# Patient Record
Sex: Female | Born: 1950 | Race: White | Hispanic: No | Marital: Married | Smoking: Former smoker
Health system: Southern US, Community
[De-identification: ages and names within clinical notes are randomized; demographics above are authoritative.]

## PROBLEM LIST (undated history)

## (undated) DIAGNOSIS — Z87442 Personal history of urinary calculi: Secondary | ICD-10-CM

## (undated) DIAGNOSIS — J40 Bronchitis, not specified as acute or chronic: Secondary | ICD-10-CM

## (undated) DIAGNOSIS — N390 Urinary tract infection, site not specified: Secondary | ICD-10-CM

## (undated) DIAGNOSIS — K759 Inflammatory liver disease, unspecified: Secondary | ICD-10-CM

## (undated) DIAGNOSIS — M1990 Unspecified osteoarthritis, unspecified site: Secondary | ICD-10-CM

## (undated) DIAGNOSIS — I639 Cerebral infarction, unspecified: Secondary | ICD-10-CM

## (undated) DIAGNOSIS — I739 Peripheral vascular disease, unspecified: Secondary | ICD-10-CM

## (undated) DIAGNOSIS — J45909 Unspecified asthma, uncomplicated: Secondary | ICD-10-CM

## (undated) DIAGNOSIS — J189 Pneumonia, unspecified organism: Secondary | ICD-10-CM

## (undated) DIAGNOSIS — C801 Malignant (primary) neoplasm, unspecified: Secondary | ICD-10-CM

## (undated) HISTORY — PX: BREAST SURGERY: SHX581

## (undated) HISTORY — PX: TONSILLECTOMY: SUR1361

---

## 1995-08-14 HISTORY — PX: FOOT SURGERY: SHX648

## 2005-08-13 DIAGNOSIS — I739 Peripheral vascular disease, unspecified: Secondary | ICD-10-CM

## 2005-08-13 DIAGNOSIS — I639 Cerebral infarction, unspecified: Secondary | ICD-10-CM

## 2005-08-13 HISTORY — DX: Peripheral vascular disease, unspecified: I73.9

## 2005-08-13 HISTORY — DX: Cerebral infarction, unspecified: I63.9

## 2016-07-26 ENCOUNTER — Ambulatory Visit: Payer: Self-pay | Admitting: Physician Assistant

## 2016-08-08 ENCOUNTER — Encounter (HOSPITAL_COMMUNITY)
Admission: RE | Admit: 2016-08-08 | Discharge: 2016-08-08 | Disposition: A | Discharge status: Home / Self Care | Payer: Worker's Compensation | Source: Ambulatory Visit | Attending: Orthopedic Surgery | Admitting: Orthopedic Surgery

## 2016-08-08 ENCOUNTER — Encounter (HOSPITAL_COMMUNITY): Payer: Self-pay | Admitting: *Deleted

## 2016-08-08 DIAGNOSIS — Z01812 Encounter for preprocedural laboratory examination: Secondary | ICD-10-CM | POA: Diagnosis not present

## 2016-08-08 HISTORY — DX: Unspecified osteoarthritis, unspecified site: M19.90

## 2016-08-08 HISTORY — DX: Malignant (primary) neoplasm, unspecified: C80.1

## 2016-08-08 HISTORY — DX: Urinary tract infection, site not specified: N39.0

## 2016-08-08 HISTORY — DX: Inflammatory liver disease, unspecified: K75.9

## 2016-08-08 HISTORY — DX: Unspecified asthma, uncomplicated: J45.909

## 2016-08-08 HISTORY — DX: Pneumonia, unspecified organism: J18.9

## 2016-08-08 HISTORY — DX: Personal history of urinary calculi: Z87.442

## 2016-08-08 HISTORY — DX: Cerebral infarction, unspecified: I63.9

## 2016-08-08 HISTORY — DX: Peripheral vascular disease, unspecified: I73.9

## 2016-08-08 HISTORY — DX: Bronchitis, not specified as acute or chronic: J40

## 2016-08-08 LAB — COMPREHENSIVE METABOLIC PANEL
ALBUMIN: 4 g/dL (ref 3.5–5.0)
ALK PHOS: 46 U/L (ref 38–126)
ALT: 25 U/L (ref 14–54)
AST: 43 U/L — ABNORMAL HIGH (ref 15–41)
Anion gap: 7 (ref 5–15)
BUN: 19 mg/dL (ref 6–20)
CALCIUM: 10.1 mg/dL (ref 8.9–10.3)
CO2: 27 mmol/L (ref 22–32)
CREATININE: 0.67 mg/dL (ref 0.44–1.00)
Chloride: 104 mmol/L (ref 101–111)
GFR calc Af Amer: >60 mL/min (ref >60)
GFR calc non Af Amer: >60 mL/min (ref >60)
GLUCOSE: 86 mg/dL (ref 65–99)
Potassium: 4.7 mmol/L (ref 3.5–5.1)
SODIUM: 138 mmol/L (ref 135–145)
Total Bilirubin: 0.9 mg/dL (ref 0.3–1.2)
Total Protein: 6.8 g/dL (ref 6.5–8.1)

## 2016-08-08 LAB — CBC
HEMATOCRIT: 39 % (ref 36.0–46.0)
HEMOGLOBIN: 12.9 g/dL (ref 12.0–15.0)
MCH: 30.2 pg (ref 26.0–34.0)
MCHC: 33.1 g/dL (ref 30.0–36.0)
MCV: 91.3 fL (ref 78.0–100.0)
Platelets: 310 10*3/uL (ref 150–400)
RBC: 4.27 MIL/uL (ref 3.87–5.11)
RDW: 14 % (ref 11.5–15.5)
WBC: 4.4 10*3/uL (ref 4.0–10.5)

## 2016-08-08 NOTE — Pre-Procedure Instructions (Addendum)
Roberta Travis  08/08/2016      [id number] [place], [place] [address] Phone: [phone] Fax: [phone]    Your procedure is scheduled on 08/16/15  Report to [facility] Admitting at 630 A.M.  Call this number if you have problems the morning of surgery:  [phone]   Remember:  Do not eat food or drink liquids after midnight.  Take these medicines the morning of surgery with A SIP OF WATER  Gabapentin, inhaler if needed(bring to hosp)., doxycycline   STOP all herbel meds, nsaids (aleve,naproxen,advil,ibuprofen)startingTODAY including all vitamins,supplements,aspirin   Do not wear jewelry, make-up or nail polish.  Do not wear lotions, powders, or perfumes, or deoderant.  Do not shave 48 hours prior to surgery.  Men may shave face and neck.  Do not bring valuables to the hospital.  [facility] is not responsible for any belongings or valuables.  Contacts, dentures or bridgework may not be worn into surgery.  Leave your suitcase in the car.  After surgery it may be brought to your room.  For patients admitted to the hospital, discharge time will be determined by your treatment team.  Patients discharged the day of surgery will not be allowed to drive home.   Special instructions:  . Special Instructions: [facility] - Preparing for Surgery  Before surgery, you can play an important role.  Because skin is not sterile, your skin needs to be as free of germs as possible.  You can reduce the number of germs on you skin by washing with CHG (chlorahexidine gluconate) soap before surgery.  CHG is an antiseptic cleaner which kills germs and bonds with the skin to continue killing germs even after washing.  Please DO NOT use if you have an allergy to CHG or antibacterial soaps.  If your skin becomes reddened/irritated stop using the CHG and inform your nurse when you arrive at [facility]  Stay.  Do not shave (including legs and underarms) for at least 48 hours prior to the first CHG shower.  You may shave your face.  Please follow these instructions carefully:   1.  Shower with CHG Soap the night before surgery and the morning of Surgery.  2.  If you choose to wash your hair, wash your hair first as usual with your normal shampoo.  3.  After you shampoo, rinse your hair and body thoroughly to remove the Shampoo.  4.  Use CHG as you would any other liquid soap.  You can apply chg directly  to the skin and wash gently with scrungie or a clean washcloth.  5.  Apply the CHG Soap to your body ONLY FROM THE NECK DOWN.  Do not use on open wounds or open sores.  Avoid contact with your eyes ears, mouth and genitals (private parts).  Wash genitals (private parts)       with your normal soap.  6.  Wash thoroughly, paying special attention to the area where your surgery will be performed.  7.  Thoroughly rinse your body with warm water from the neck down.  8.  DO NOT shower/wash with your normal soap after using and rinsing off the CHG Soap.  9.  Pat yourself dry with a clean towel.            10.  Wear clean pajamas.            11.  Place clean sheets on  your bed the night of your first shower and do not sleep with pets.  Day of Surgery  Do not apply any lotions/deodorants the morning of surgery.  Please wear clean clothes to the hospital/surgery center.  Please read over the factt sheets that you were given.

## 2016-08-14 NOTE — Anesthesia Preprocedure Evaluation (Signed)
Anesthesia Evaluation  Patient identified by MRN, date of birth, ID band Patient awake    Reviewed: Allergy & Precautions, H&P , Patient's Chart, lab work & pertinent test results, reviewed documented beta blocker date and time   Airway Mallampati: II  TM Distance: >3 FB Neck ROM: full    Dental no notable dental hx.    Pulmonary former smoker,    Pulmonary exam normal breath sounds clear to auscultation       Cardiovascular  Rhythm:regular Rate:Normal     Neuro/Psych    GI/Hepatic   Endo/Other    Renal/GU      Musculoskeletal   Abdominal   Peds  Hematology   Anesthesia Other Findings   Reproductive/Obstetrics                             Anesthesia Physical Anesthesia Plan  ASA: II  Anesthesia Plan: General   Post-op Pain Management:    Induction: Intravenous  Airway Management Planned: Oral ETT  Additional Equipment:   Intra-op Plan:   Post-operative Plan: Extubation in OR  Informed Consent: I have reviewed the patients History and Physical, chart, labs and discussed the procedure including the risks, benefits and alternatives for the proposed anesthesia with the patient or authorized representative who has indicated his/her understanding and acceptance.   Dental Advisory Given and Dental advisory given  Plan Discussed with: CRNA and Surgeon  Anesthesia Plan Comments: (  Discussed general anesthesia, including possible nausea, instrumentation of airway, sore throat,pulmonary aspiration, etc. I asked if the were any outstanding questions, or  concerns before we proceeded.)        Anesthesia Quick Evaluation  

## 2016-08-15 ENCOUNTER — Ambulatory Visit (HOSPITAL_COMMUNITY): Payer: Worker's Compensation | Admitting: Anesthesiology

## 2016-08-15 ENCOUNTER — Observation Stay (HOSPITAL_COMMUNITY)
Admission: RE | Admit: 2016-08-15 | Discharge: 2016-08-15 | Disposition: A | Discharge status: Home / Self Care | Payer: Worker's Compensation | Source: Ambulatory Visit | Attending: Orthopedic Surgery | Admitting: Orthopedic Surgery

## 2016-08-15 ENCOUNTER — Encounter (HOSPITAL_COMMUNITY)
Admission: RE | Disposition: A | Discharge status: Home / Self Care | Payer: Self-pay | Source: Ambulatory Visit | Attending: Orthopedic Surgery

## 2016-08-15 ENCOUNTER — Encounter (HOSPITAL_COMMUNITY): Payer: Self-pay | Admitting: Certified Registered Nurse Anesthetist

## 2016-08-15 ENCOUNTER — Ambulatory Visit (HOSPITAL_COMMUNITY): Payer: Worker's Compensation

## 2016-08-15 DIAGNOSIS — Z85828 Personal history of other malignant neoplasm of skin: Secondary | ICD-10-CM | POA: Diagnosis not present

## 2016-08-15 DIAGNOSIS — J45909 Unspecified asthma, uncomplicated: Secondary | ICD-10-CM | POA: Insufficient documentation

## 2016-08-15 DIAGNOSIS — S32040A Wedge compression fracture of fourth lumbar vertebra, initial encounter for closed fracture: Secondary | ICD-10-CM | POA: Diagnosis present

## 2016-08-15 DIAGNOSIS — I739 Peripheral vascular disease, unspecified: Secondary | ICD-10-CM | POA: Diagnosis not present

## 2016-08-15 DIAGNOSIS — Z86718 Personal history of other venous thrombosis and embolism: Secondary | ICD-10-CM | POA: Insufficient documentation

## 2016-08-15 DIAGNOSIS — M8088XA Other osteoporosis with current pathological fracture, vertebra(e), initial encounter for fracture: Secondary | ICD-10-CM | POA: Diagnosis not present

## 2016-08-15 DIAGNOSIS — Z8673 Personal history of transient ischemic attack (TIA), and cerebral infarction without residual deficits: Secondary | ICD-10-CM | POA: Insufficient documentation

## 2016-08-15 DIAGNOSIS — Z419 Encounter for procedure for purposes other than remedying health state, unspecified: Secondary | ICD-10-CM

## 2016-08-15 DIAGNOSIS — Z87442 Personal history of urinary calculi: Secondary | ICD-10-CM | POA: Diagnosis not present

## 2016-08-15 DIAGNOSIS — Z87891 Personal history of nicotine dependence: Secondary | ICD-10-CM | POA: Insufficient documentation

## 2016-08-15 DIAGNOSIS — M1990 Unspecified osteoarthritis, unspecified site: Secondary | ICD-10-CM | POA: Insufficient documentation

## 2016-08-15 DIAGNOSIS — M4316 Spondylolisthesis, lumbar region: Secondary | ICD-10-CM | POA: Diagnosis not present

## 2016-08-15 HISTORY — PX: KYPHOPLASTY: SHX5884

## 2016-08-15 SURGERY — KYPHOPLASTY
Anesthesia: General | Site: Spine Lumbar

## 2016-08-15 MED ORDER — OXYCODONE-ACETAMINOPHEN 5-325 MG PO TABS: 1.0000 | ORAL_TABLET | ORAL | Status: DC | PRN

## 2016-08-15 MED ORDER — MENTHOL 3 MG MT LOZG: 1.0000 | LOZENGE | OROMUCOSAL | Status: DC | PRN

## 2016-08-15 MED ORDER — PROPOFOL 10 MG/ML IV BOLUS
INTRAVENOUS | Status: AC
  Filled 2016-08-15: qty 20

## 2016-08-15 MED ORDER — PROPOFOL 10 MG/ML IV BOLUS
INTRAVENOUS | Status: DC | PRN
  Administered 2016-08-15 (×2): 50 mg via INTRAVENOUS
  Administered 2016-08-15: 150 mg via INTRAVENOUS

## 2016-08-15 MED ORDER — IOPAMIDOL (ISOVUE-300) INJECTION 61%
INTRAVENOUS | Status: AC
  Filled 2016-08-15: qty 50

## 2016-08-15 MED ORDER — EPHEDRINE SULFATE 50 MG/ML IJ SOLN
INTRAMUSCULAR | Status: DC | PRN
  Administered 2016-08-15: 10 mg via INTRAVENOUS
  Administered 2016-08-15: 5 mg via INTRAVENOUS
  Administered 2016-08-15: 10 mg via INTRAVENOUS

## 2016-08-15 MED ORDER — FENTANYL CITRATE (PF) 100 MCG/2ML IJ SOLN: 25.0000 ug | INTRAMUSCULAR | Status: DC | PRN

## 2016-08-15 MED ORDER — DEXAMETHASONE SODIUM PHOSPHATE 10 MG/ML IJ SOLN
INTRAMUSCULAR | Status: AC
  Filled 2016-08-15: qty 1

## 2016-08-15 MED ORDER — ALBUTEROL SULFATE HFA 108 (90 BASE) MCG/ACT IN AERS
INHALATION_SPRAY | RESPIRATORY_TRACT | Status: DC | PRN
  Administered 2016-08-15: 2 via RESPIRATORY_TRACT

## 2016-08-15 MED ORDER — METHOCARBAMOL 500 MG PO TABS: 500.0000 mg | ORAL_TABLET | Freq: Four times a day (QID) | ORAL | Status: DC | PRN

## 2016-08-15 MED ORDER — SODIUM CHLORIDE 0.9% FLUSH: 3.0000 mL | Freq: Two times a day (BID) | INTRAVENOUS | Status: DC

## 2016-08-15 MED ORDER — ALBUTEROL SULFATE HFA 108 (90 BASE) MCG/ACT IN AERS
INHALATION_SPRAY | RESPIRATORY_TRACT | Status: AC
  Filled 2016-08-15: qty 6.7

## 2016-08-15 MED ORDER — ONDANSETRON HCL 4 MG PO TABS: 4.0000 mg | ORAL_TABLET | Freq: Three times a day (TID) | ORAL | 0 refills | Status: AC | PRN

## 2016-08-15 MED ORDER — GLYCOPYRROLATE 0.2 MG/ML IJ SOLN
INTRAMUSCULAR | Status: DC | PRN
  Administered 2016-08-15 (×2): 0.1 mg via INTRAVENOUS

## 2016-08-15 MED ORDER — BUPIVACAINE HCL (PF) 0.25 % IJ SOLN
INTRAMUSCULAR | Status: DC | PRN
  Administered 2016-08-15: 10 mL

## 2016-08-15 MED ORDER — ACETAMINOPHEN 10 MG/ML IV SOLN
INTRAVENOUS | Status: AC
  Filled 2016-08-15: qty 100

## 2016-08-15 MED ORDER — LACTATED RINGERS IV SOLN
INTRAVENOUS | Status: DC | PRN
  Administered 2016-08-15 (×2): via INTRAVENOUS

## 2016-08-15 MED ORDER — FENTANYL CITRATE (PF) 100 MCG/2ML IJ SOLN
INTRAMUSCULAR | Status: AC
  Filled 2016-08-15: qty 2

## 2016-08-15 MED ORDER — CEFAZOLIN SODIUM-DEXTROSE 2-4 GM/100ML-% IV SOLN
2.0000 g | INTRAVENOUS | Status: AC
  Administered 2016-08-15: 2 g via INTRAVENOUS
  Filled 2016-08-15: qty 100

## 2016-08-15 MED ORDER — DEXAMETHASONE SODIUM PHOSPHATE 10 MG/ML IJ SOLN
INTRAMUSCULAR | Status: DC | PRN
  Administered 2016-08-15: 10 mg via INTRAVENOUS

## 2016-08-15 MED ORDER — DEXAMETHASONE SODIUM PHOSPHATE 4 MG/ML IJ SOLN: 4.0000 mg | Freq: Four times a day (QID) | INTRAMUSCULAR | Status: DC

## 2016-08-15 MED ORDER — FENTANYL CITRATE (PF) 100 MCG/2ML IJ SOLN
INTRAMUSCULAR | Status: DC | PRN
  Administered 2016-08-15: 100 ug via INTRAVENOUS

## 2016-08-15 MED ORDER — LIDOCAINE HCL (CARDIAC) 20 MG/ML IV SOLN
INTRAVENOUS | Status: DC | PRN
  Administered 2016-08-15: 100 mg via INTRATRACHEAL

## 2016-08-15 MED ORDER — SODIUM CHLORIDE 0.9% FLUSH: 3.0000 mL | INTRAVENOUS | Status: DC | PRN

## 2016-08-15 MED ORDER — METHOCARBAMOL 500 MG PO TABS: 500.0000 mg | ORAL_TABLET | Freq: Three times a day (TID) | ORAL | 0 refills | Status: AC | PRN

## 2016-08-15 MED ORDER — ARTIFICIAL TEARS OP OINT
TOPICAL_OINTMENT | OPHTHALMIC | Status: AC
  Filled 2016-08-15: qty 3.5

## 2016-08-15 MED ORDER — IOPAMIDOL (ISOVUE-300) INJECTION 61%
INTRAVENOUS | Status: DC | PRN
  Administered 2016-08-15: 50 mL

## 2016-08-15 MED ORDER — LIDOCAINE 2% (20 MG/ML) 5 ML SYRINGE
INTRAMUSCULAR | Status: AC
  Filled 2016-08-15: qty 5

## 2016-08-15 MED ORDER — SUGAMMADEX SODIUM 200 MG/2ML IV SOLN
INTRAVENOUS | Status: DC | PRN
  Administered 2016-08-15: 200 mg via INTRAVENOUS

## 2016-08-15 MED ORDER — METHOCARBAMOL 1000 MG/10ML IJ SOLN
500.0000 mg | Freq: Four times a day (QID) | INTRAVENOUS | Status: DC | PRN
  Filled 2016-08-15: qty 5

## 2016-08-15 MED ORDER — DEXAMETHASONE 4 MG PO TABS: 4.0000 mg | ORAL_TABLET | Freq: Four times a day (QID) | ORAL | Status: DC

## 2016-08-15 MED ORDER — ROCURONIUM BROMIDE 50 MG/5ML IV SOSY
PREFILLED_SYRINGE | INTRAVENOUS | Status: AC
  Filled 2016-08-15: qty 5

## 2016-08-15 MED ORDER — PHENYLEPHRINE 40 MCG/ML (10ML) SYRINGE FOR IV PUSH (FOR BLOOD PRESSURE SUPPORT)
PREFILLED_SYRINGE | INTRAVENOUS | Status: AC
  Filled 2016-08-15: qty 20

## 2016-08-15 MED ORDER — KETOROLAC TROMETHAMINE 30 MG/ML IJ SOLN: 15.0000 mg | Freq: Four times a day (QID) | INTRAMUSCULAR | Status: DC

## 2016-08-15 MED ORDER — MIDAZOLAM HCL 2 MG/2ML IJ SOLN
INTRAMUSCULAR | Status: AC
  Filled 2016-08-15: qty 2

## 2016-08-15 MED ORDER — EPHEDRINE 5 MG/ML INJ
INTRAVENOUS | Status: AC
  Filled 2016-08-15: qty 10

## 2016-08-15 MED ORDER — LACTATED RINGERS IV SOLN: INTRAVENOUS | Status: DC

## 2016-08-15 MED ORDER — MIDAZOLAM HCL 2 MG/2ML IJ SOLN
INTRAMUSCULAR | Status: DC | PRN
  Administered 2016-08-15 (×2): 1 mg via INTRAVENOUS

## 2016-08-15 MED ORDER — MORPHINE SULFATE (PF) 4 MG/ML IV SOLN: 1.0000 mg | INTRAVENOUS | Status: DC | PRN

## 2016-08-15 MED ORDER — ONDANSETRON HCL 4 MG/2ML IJ SOLN
INTRAMUSCULAR | Status: AC
  Filled 2016-08-15: qty 2

## 2016-08-15 MED ORDER — ONDANSETRON HCL 4 MG/2ML IJ SOLN: 4.0000 mg | INTRAMUSCULAR | Status: DC | PRN

## 2016-08-15 MED ORDER — ONDANSETRON HCL 4 MG/2ML IJ SOLN
INTRAMUSCULAR | Status: DC | PRN
  Administered 2016-08-15: 4 mg via INTRAVENOUS

## 2016-08-15 MED ORDER — ACETAMINOPHEN 10 MG/ML IV SOLN
INTRAVENOUS | Status: DC | PRN
  Administered 2016-08-15: 1000 mg via INTRAVENOUS

## 2016-08-15 MED ORDER — BUPIVACAINE HCL (PF) 0.25 % IJ SOLN
INTRAMUSCULAR | Status: AC
  Filled 2016-08-15: qty 30

## 2016-08-15 MED ORDER — ROCURONIUM BROMIDE 100 MG/10ML IV SOLN
INTRAVENOUS | Status: DC | PRN
  Administered 2016-08-15: 40 mg via INTRAVENOUS

## 2016-08-15 MED ORDER — SUGAMMADEX SODIUM 200 MG/2ML IV SOLN
INTRAVENOUS | Status: AC
  Filled 2016-08-15: qty 2

## 2016-08-15 MED ORDER — PHENYLEPHRINE HCL 10 MG/ML IJ SOLN
INTRAMUSCULAR | Status: DC | PRN
  Administered 2016-08-15: 120 ug via INTRAVENOUS
  Administered 2016-08-15 (×3): 200 ug via INTRAVENOUS
  Administered 2016-08-15: 80 ug via INTRAVENOUS

## 2016-08-15 MED ORDER — HYDROCODONE-ACETAMINOPHEN 5-325 MG PO TABS: 1.0000 | ORAL_TABLET | ORAL | 0 refills | Status: AC | PRN

## 2016-08-15 MED ORDER — CEFAZOLIN IN D5W 1 GM/50ML IV SOLN: 1.0000 g | Freq: Three times a day (TID) | INTRAVENOUS | Status: DC

## 2016-08-15 MED ORDER — PHENOL 1.4 % MT LIQD: 1.0000 | OROMUCOSAL | Status: DC | PRN

## 2016-08-15 SURGICAL SUPPLY — 44 items
BANDAGE ADH SHEER 1  50/CT (GAUZE/BANDAGES/DRESSINGS) ×2 IMPLANT
BLADE SURG 15 STRL LF DISP TIS (BLADE) ×1 IMPLANT
BLADE SURG 15 STRL SS (BLADE) ×1
BONE FILLER DEVICE STRL SZ3 (INSTRUMENTS) IMPLANT
CEMENT BONE KYPHX HV R (Orthopedic Implant) ×2 IMPLANT
CEMENT KYPHON C01A KIT/MIXER (Cement) ×2 IMPLANT
COVER MAYO STAND STRL (DRAPES) ×2 IMPLANT
CURETTE EXPRESS SZ2 7MM (INSTRUMENTS) ×1 IMPLANT
CURETTE WEDGE 8.5MM KYPHX (MISCELLANEOUS) IMPLANT
CURRETTE EXPRESS SZ2 7MM (INSTRUMENTS) ×2
DRAPE C-ARM 42X72 X-RAY (DRAPES) ×4 IMPLANT
DRAPE INCISE IOBAN 66X45 STRL (DRAPES) ×2 IMPLANT
DRAPE LAPAROTOMY T 102X78X121 (DRAPES) ×2 IMPLANT
DRAPE PROXIMA HALF (DRAPES) ×4 IMPLANT
DRSG AQUACEL AG ADV 3.5X10 (GAUZE/BANDAGES/DRESSINGS) ×2 IMPLANT
DURAPREP 26ML APPLICATOR (WOUND CARE) ×2 IMPLANT
ELECT PENCIL ROCKER SW 15FT (MISCELLANEOUS) ×2 IMPLANT
GAUZE SPONGE 4X4 16PLY XRAY LF (GAUZE/BANDAGES/DRESSINGS) ×2 IMPLANT
GLOVE BIO SURGEON STRL SZ 6.5 (GLOVE) ×2 IMPLANT
GLOVE BIOGEL PI IND STRL 6.5 (GLOVE) ×1 IMPLANT
GLOVE BIOGEL PI INDICATOR 6.5 (GLOVE) ×1
GLOVE SS BIOGEL STRL SZ 8.5 (GLOVE) ×2 IMPLANT
GLOVE SUPERSENSE BIOGEL SZ 8.5 (GLOVE) ×2
GOWN STRL REUS W/ TWL LRG LVL3 (GOWN DISPOSABLE) ×1 IMPLANT
GOWN STRL REUS W/TWL 2XL LVL3 (GOWN DISPOSABLE) ×2 IMPLANT
GOWN STRL REUS W/TWL LRG LVL3 (GOWN DISPOSABLE) ×1
KIT BASIN OR (CUSTOM PROCEDURE TRAY) ×2 IMPLANT
KIT ROOM TURNOVER OR (KITS) ×2 IMPLANT
NEEDLE HYPO 25X1 1.5 SAFETY (NEEDLE) ×2 IMPLANT
NEEDLE SPNL 18GX3.5 QUINCKE PK (NEEDLE) ×4 IMPLANT
NS IRRIG 1000ML POUR BTL (IV SOLUTION) ×2 IMPLANT
PACK SURGICAL SETUP 50X90 (CUSTOM PROCEDURE TRAY) ×2 IMPLANT
PACK UNIVERSAL I (CUSTOM PROCEDURE TRAY) ×2 IMPLANT
PAD ARMBOARD 7.5X6 YLW CONV (MISCELLANEOUS) ×4 IMPLANT
SURGIFLO W/THROMBIN 8M KIT (HEMOSTASIS) IMPLANT
SUT BONE WAX W31G (SUTURE) ×2 IMPLANT
SUT MON AB 3-0 SH 27 (SUTURE) ×1
SUT MON AB 3-0 SH27 (SUTURE) ×1 IMPLANT
SYR CONTROL 10ML LL (SYRINGE) ×2 IMPLANT
TOWEL OR 17X24 6PK STRL BLUE (TOWEL DISPOSABLE) ×2 IMPLANT
TOWEL OR 17X26 10 PK STRL BLUE (TOWEL DISPOSABLE) ×2 IMPLANT
TRAY KYPHOPAK 15/3 ONESTEP 1ST (MISCELLANEOUS) IMPLANT
TRAY KYPHOPAK 20/3 ONESTEP 1ST (MISCELLANEOUS) ×4 IMPLANT
WATER STERILE IRR 1000ML POUR (IV SOLUTION) ×2 IMPLANT

## 2016-08-15 NOTE — Transfer of Care (Signed)
Immediate Anesthesia Transfer of Care Note  Patient: Roberta Travis  Procedure(s) Performed: Procedure(s) with comments: L4 KYPHOPLASTY (N/A) - Requests 1 hour  Patient Location: PACU  Anesthesia Type:General  Level of Consciousness: awake, alert  and patient cooperative  Airway & Oxygen Therapy: Patient Spontanous Breathing and Patient connected to nasal cannula oxygen  Post-op Assessment: Report given to RN, Post -op Vital signs reviewed and stable, Patient moving all extremities X 4 and Patient able to stick tongue midline  Post vital signs: Reviewed and stable  Last Vitals:  Vitals:   08/15/16 0707 08/15/16 0941  BP: 133/63   Pulse: 75   Resp: 20   Temp: 36.6 C (P) 36.5 C    Last Pain:  Vitals:   08/15/16 0715  TempSrc:   PainSc: 3          Complications: No apparent anesthesia complications

## 2016-08-15 NOTE — H&P (Signed)
History of Present Illness  The patient is a 66 year old female who comes in today for a preoperative History and Physical. The patient is scheduled for a kyphoplasty L4 to be performed by Dr. Duane Lope D. Rolena Infante, MD at [facility] on 08-15-16 . Please see the hospital record for complete dictated history and physical. The pt reports a hx of a stroke 10 year ago.   Problem List/Past Medical  Lumbar spine pain (M54.5)  Spondylolisthesis of lumbar region (M43.16)  Lumbosacral DDD (M51.37)  Closed wedge compression fracture of fourth lumbar vertebra with routine healing (S32.040D)  Problems Reconciled   Allergies  Sulfa Drugs  EPINEPHrine *VASOPRESSORS*  Allergies Reconciled   Family History  Cancer  father, sister and brother Congestive Heart Failure  mother Hypertension  father and brother Osteoarthritis  mother Osteoporosis  mother and sister Severe allergy  father First Degree Relatives   Social History Tobacco use  former smoker; smoke(d) less than 1/2 pack(s) per day Tobacco / smoke exposure  no Alcohol use  current drinker; drinks wine and hard liquor; only occasionally per week Children  3 Current work status  working part time Drug/Alcohol Rehab (Currently)  no Drug/Alcohol Rehab (Previously)  no Exercise  Exercises daily; does running / walking and gym / weights Illicit drug use  no Living situation  live with spouse Marital status  married Number of flights of stairs before winded  greater than 5 Pain Contract  no  Medication History  Atorvastatin Calcium (20MG  Tablet, Oral) Active. Calcium Carbonate (600MG  Tablet, Oral) Active. DiphenhydrAMINE HCl (25MG  Tablet, Oral) Active. (prn) Doxycycline Hyclate (100MG  Capsule, Oral) Active. (qd) Metamucil (Oral) Specific strength unknown - Active. (qd) Vitamin D (2000UNIT Tablet, Oral) Active. (qd) Gabapentin (300MG  Capsule, Oral) Active. (bid Rx' d by Dr Bland Span ,  Neurologist) Tylenol Extra Strength (500MG  Tablet, Oral) Active. Medications Reconciled  Past Surgical History Breast Biopsy  left Foot Surgery  bilateral Tonsillectomy    Other Problems  Diverticulitis Of Colon  Kidney Stone   Vitals  08/14/2016 9:48 AM Weight: 155 lb Height: 66in Body Surface Area: 1.79 m Body Mass Index: 25.02 kg/m  Temp.: 97.38F(Oral)  Pulse: 77 (Regular)  BP: 153/81 (Sitting, Right Arm, Standard)  General General Appearance-Not in acute distress. Orientation-Oriented X3. Build & Nutrition-Well nourished and Well developed.  Integumentary General Characteristics Surgical Scars - no surgical scar evidence of previous lumbar surgery. Lumbar Spine-Skin examination of the lumbar spine is without deformity, skin lesions, lacerations or abrasions.  Chest and Lung Exam Auscultation Breath sounds - Normal and Clear.  Cardiovascular Auscultation Rhythm - Regular rate and rhythm.  Abdomen Palpation/Percussion Palpation and Percussion of the abdomen reveal - Soft, Non Tender and No Rebound tenderness.  Peripheral Vascular Lower Extremity Palpation - Posterior tibial pulse - Bilateral - 2+. Dorsalis pedis pulse - Bilateral - 2+.  Neurologic Sensation Lower Extremity - Bilateral - sensation is intact in the lower extremity. Reflexes Patellar Reflex - Bilateral - 2+. Achilles Reflex - Bilateral - 2+. Clonus - Bilateral - clonus not present. Hoffman's Sign - Bilateral - Hoffman's sign not present. Testing Seated Straight Leg Raise - Bilateral - Seated straight leg raise negative.  Musculoskeletal Spine/Ribs/Pelvis  Lumbosacral Spine: Inspection and Palpation - Tenderness - left lumbar paraspinals tender to palpation and right lumbar paraspinals tender to palpation. Strength and Tone: Strength - Hip Flexion - Bilateral - 5/5. Knee Extension - Bilateral - 5/5. Knee Flexion - Bilateral - 5/5. Ankle Dorsiflexion - Bilateral -  5/5. Ankle Plantarflexion -  Bilateral - 5/5. Heel walk - Bilateral - able to heel walk without difficulty. Toe Walk - Bilateral - able to walk on toes without difficulty. Heel-Toe Walk - Bilateral - able to heel-toe walk without difficulty. ROM - Flexion - moderately decreased range of motion and painful. Extension - moderately decreased range of motion and painful. Left Lateral Bending - moderately decreased range of motion and painful. Right Lateral Bending - moderately decreased range of motion and painful. Right Rotation - moderately decreased range of motion and painful. Left Rotation - moderately decreased range of motion and painful. Pain - neither flexion or extension is more painful than the other. Lumbosacral Spine - Waddell's Signs - no Waddell's signs present. Lower Extremity Range of Motion - No true hip, knee or ankle pain with range of motion. Gait and Station - [employer] - no assistive devices.  IMAGING At this point in time, the MRI actually showed edema at the L4 vertebral body, looking like progressive benign fracture of L4, most likely acute on chronic. 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 There is also mention of a small cystic structure in the expected location of the kidney, which on this study appears to be bowel, but I do want to make sure her primary care physician is made aware of this and we will monitor it.  Assessment & Plan   Goal Of Surgery: Discussed that goal of surgery is to reduce pain and improve function and quality of life. Patient is aware that despite all appropriate treatment that there pain and function could be the same, worse, or different.  At this point, we have had a long discussion about her pain generator. I do think that the fracture, though it occurred in May, there is still finds of edema and ongoing issues with it. At this point, I think moving forward with the kyphoplasty will  provide [place] an opportunity to relieve the back pain that the fracture is contributing to. The outside concern I have is that the spondylolisthesis at L4-5 may also be contributing to her back pain and to address that, it would require fusion, however. The presence of the cement could affect treatment option if it were to become necessary. However, at this point after discussing this with the patient, we both agree that it is best to move forward with just the kyphoplasty as it is easier operation, less recovery time and deal with any residual back problems from the slip hopefully non-operatively, but if we have to fix it, most likely would be an anterior fixation. I have reviewed the risks which include infection, bleeding, nerve damage, death, stroke, paralysis, cement leak, ongoing or worse pain, need for further surgery.  The goal is to help alleviate the fracture-related back pain and improve her quality of life and allow her to get back to her regular activities. We will get preoperative medical clearance from her primary care physician and I will move forward with this in a very timely fashion.

## 2016-08-15 NOTE — Anesthesia Procedure Notes (Signed)
Procedure Name: Intubation Date/Time: 08/15/2016 8:38 AM Performed by: Lyndle Herrlich Pre-anesthesia Checklist: Patient identified, Emergency Drugs available, Suction available and Patient being monitored Patient Re-evaluated:Patient Re-evaluated prior to inductionOxygen Delivery Method: Circle system utilized Preoxygenation: Pre-oxygenation with 100% oxygen Intubation Type: IV induction Ventilation: Mask ventilation without difficulty Laryngoscope Size: Mac and 3 Grade View: Grade I Tube type: Oral Tube size: 7.0 mm Airway Equipment and Method: Stylet Placement Confirmation: ETT inserted through vocal cords under direct vision,  positive ETCO2 and breath sounds checked- equal and bilateral Secured at: 23 cm Tube secured with: Tape Dental Injury: Teeth and Oropharynx as per pre-operative assessment

## 2016-08-15 NOTE — Brief Op Note (Signed)
08/15/2016  9:55 AM  PATIENT:  Trenton Founds  66 y.o. female  PRE-OPERATIVE DIAGNOSIS:  L4 Compression fracture  POST-OPERATIVE DIAGNOSIS:  L4 Compression fracture  PROCEDURE:  Procedure(s) with comments: L4 KYPHOPLASTY (N/A) - Requests 1 hour  SURGEON:  Surgeon(s) and Role:    * Melina Schools, MD - Primary  PHYSICIAN ASSISTANT:   ASSISTANTS: none   ANESTHESIA:   general  EBL:  Total I/O In: 1500 [I.V.:1500] Out: 10 [Blood:10]  BLOOD ADMINISTERED:none  DRAINS: none   LOCAL MEDICATIONS USED:  MARCAINE     SPECIMEN:  No Specimen  DISPOSITION OF SPECIMEN:  N/A  COUNTS:  YES  TOURNIQUET:  * No tourniquets in log *  DICTATION: .Other Dictation: Dictation Number [id number]  PLAN OF CARE: Admit for overnight observation  PATIENT DISPOSITION:  PACU - hemodynamically stable.

## 2016-08-15 NOTE — Anesthesia Postprocedure Evaluation (Signed)
Anesthesia Post Note  Patient: Roberta Travis  Procedure(s) Performed: Procedure(s) (LRB): L4 KYPHOPLASTY (N/A)  Patient location during evaluation: PACU Anesthesia Type: General Level of consciousness: sedated Pain management: satisfactory to patient Vital Signs Assessment: post-procedure vital signs reviewed and stable Respiratory status: spontaneous breathing Cardiovascular status: stable Anesthetic complications: no       Last Vitals:  Vitals:   08/15/16 1040 08/15/16 1100  BP:  91/71  Pulse: 85 85  Resp: 14 18  Temp: 36.1 C 36.1 C    Last Pain:  Vitals:   08/15/16 0715  TempSrc:   PainSc: [place]

## 2016-08-15 NOTE — Evaluation (Signed)
Physical Therapy Evaluation and Discharge Patient Details Name: Roberta Travis MRN: [id number] DOB: Nov 29, 1950 Today's Date: 08/15/2016   History of Present Illness  Pt is a 66 y/o female who presents s/p kyphoplasty on 08/15/2016.  Clinical Impression  Patient evaluated by Physical Therapy with no further acute PT needs identified. All education has been completed and the patient has no further questions. At the time of PT eval pt was able to perform transfers and ambulation with modified independence, and stair training with supervision for safety. Pt was educated on car transfer, walking program, and maintenance of precautions for comfort. See below for any follow-up Physical Therapy or equipment needs. PT is signing off. Thank you for this referral.     Follow Up Recommendations Outpatient PT;Supervision - Intermittent    Equipment Recommendations  None recommended by PT    Recommendations for Other Services       Precautions / Restrictions Precautions Precautions: Fall Precaution Comments: Back precautions for comfort Restrictions Weight Bearing Restrictions: No      Mobility  Bed Mobility               General bed mobility comments: Pt sitting up on EOB when PT arrived. Reviewed log roll for comfort  Transfers Overall transfer level: Modified independent Equipment used: None Transfers: Sit to/from Stand           General transfer comment: No assist required, no unsteadiness noted  Ambulation/Gait Ambulation/Gait assistance: Modified independent (Device/Increase time) Ambulation Distance (Feet): 300 Feet Assistive device: None Gait Pattern/deviations: WFL(Within Functional Limits) Gait velocity: Decreased Gait velocity interpretation: Below normal speed for age/gender General Gait Details: Somewhat slow but overall steady gait.   Stairs Stairs: Yes Stairs assistance: Supervision Stair Management: One rail Right;Alternating pattern;Forwards Number of  Stairs: 10 General stair comments: Pt demonstrated good safety awareness. No unsteadiness noted. Pt was cued for step-to pattern if pain is increased at home.   Wheelchair Mobility    Modified Rankin (Stroke Patients Only)       Balance Overall balance assessment: No apparent balance deficits (not formally assessed)                                           Pertinent Vitals/Pain Pain Assessment: Faces Faces Pain Scale: Hurts a little bit Pain Location: Incision Pain Descriptors / Indicators: Operative site guarding Pain Intervention(s): Monitored during session    Home Living Family/patient expects to be discharged to:: Private residence Living Arrangements: Spouse/significant other Available Help at Discharge: Family;Available 24 hours/day Type of Home: House Home Access: Stairs to enter   [employer] of Steps: 3 Home Layout: Two level Home Equipment: Shower seat - built in      Prior Function Level of Independence: Independent               Hand Dominance   Dominant Hand: Right    Extremity/Trunk Assessment   Upper Extremity Assessment Upper Extremity Assessment: Overall WFL for tasks assessed    Lower Extremity Assessment Lower Extremity Assessment: Overall WFL for tasks assessed    Cervical / Trunk Assessment Cervical / Trunk Assessment: Kyphotic  Communication   Communication: No difficulties  Cognition Arousal/Alertness: Awake/alert Behavior During Therapy: WFL for tasks assessed/performed Overall Cognitive Status: Within Functional Limits for tasks assessed  General Comments      Exercises     Assessment/Plan    PT Assessment Patent does not need any further PT services  PT Problem List            PT Treatment Interventions      PT Goals (Current goals can be found in the Care Plan section)  Acute Rehab PT Goals Patient Stated Goal: Home today PT Goal Formulation: All  assessment and education complete, DC therapy    Frequency     Barriers to discharge        Co-evaluation               End of Session Equipment Utilized During Treatment: Back brace (From previous surgery) Activity Tolerance: Patient tolerated treatment well Patient left: in chair;with call bell/phone within reach;with family/visitor present Nurse Communication: Mobility status    Functional Assessment Tool Used: Clinical judgement Functional Limitation: Mobility: Walking and moving around Mobility: Walking and Moving Around Current Status [id number]): At least 1 percent but less than 20 percent impaired, limited or restricted Mobility: Walking and Moving Around Goal Status [phone]): At least 1 percent but less than 20 percent impaired, limited or restricted Mobility: Walking and Moving Around Discharge Status [phone]): At least 1 percent but less than 20 percent impaired, limited or restricted    Time: 1130-1145 PT Time Calculation (min) (ACUTE ONLY): 15 min   Charges:   PT Evaluation $PT Eval Moderate Complexity: 1 Procedure     PT G Codes:   PT G-Codes **NOT FOR INPATIENT CLASS** Functional Assessment Tool Used: Clinical judgement Functional Limitation: Mobility: Walking and moving around Mobility: Walking and Moving Around Current Status [id number]): At least 1 percent but less than 20 percent impaired, limited or restricted Mobility: Walking and Moving Around Goal Status [phone]): At least 1 percent but less than 20 percent impaired, limited or restricted Mobility: Walking and Moving Around Discharge Status [phone]): At least 1 percent but less than 20 percent impaired, limited or restricted    Thelma Comp 08/15/2016, 1:20 PM   Rolinda Roan, PT, DPT Acute Rehabilitation Services Pager: [phone]

## 2016-08-15 NOTE — Progress Notes (Signed)
Pt doing well. Pt and husband given D/C instructions with Rx's, verbal understanding was provided. Pt's incision is covered with a band-aid and has no sign of infection. Pt's IV was removed prior to D/C. Pt D/C'd home via walking @ 1405 per MD order. Pt is stable @ D/C and has no other needs at this time. Holli Humbles, RN

## 2016-08-16 ENCOUNTER — Encounter (HOSPITAL_COMMUNITY): Payer: Self-pay | Admitting: Orthopedic Surgery

## 2016-08-16 NOTE — Op Note (Signed)
NAME:  Roberta Travis, Roberta Travis                 ACCOUNT NO.:  MEDICAL RECORD NO.:  [id number]  LOCATION:                                 FACILITY:  PHYSICIAN:  Emme Rosenau D. Rolena Infante, M.D.      DATE OF BIRTH:  DATE OF PROCEDURE:  08/15/2016 DATE OF DISCHARGE:                              OPERATIVE REPORT   PREOPERATIVE DIAGNOSIS:  L4 compression from pathological fracture (compression fracture).  POSTOPERATIVE DIAGNOSIS:  L4 compression from pathological fracture (compression fracture).  OPERATIVE PROCEDURE:  L4 kyphoplasty.  COMPLICATIONS:  None.  CONDITION:  Stable.  HISTORY:  This is a very pleasant 66 year old woman who has been complaining of significant lower lumbar pain since injury back in May. Imaging studies demonstrated an L4 compression fracture as well as a grade 1 anterior listhesis.  The patient's recent MRI showed that there was ongoing edema consistent with subacute fracture.  As a result of the ongoing pain, we discussed treatment options and elected to proceed with a kyphoplasty to manage the fracture pain.  She denied any radicular leg symptoms to suggest neural compression or irritation.  All appropriate risks, benefits and alternatives were discussed with the patient and consent was obtained.  OPERATIVE NOTE:  The patient was brought to the operating room and placed supine on the operating table.  The patient was brought into the operating room and placed supine on the operating table.  After successful induction of general anesthesia and endotracheal intubation, TEDs and SCDs were applied.  She was turned prone on pelvic and chest gel rolls and the back was prepped and draped in a standard fashion. Time-out was taken confirming patient, procedure and all other pertinent important data.  Once this was completed, I then identified the lateral border of the L4 pedicle with AP and lateral fluoroscopy and made a small incision.  I advanced the Jamshidi needle down to  the lateral aspect of the pedicle and advanced into the pedicle.  I went across the pedicle until I was nearing the medial wall of it and then checked the lateral view to ensure that my trajectory was correct and that I was just beyond the posterior wall of the vertebral body.  Once I confirmed transpedicular approach, I advanced it into the vertebral body.  I repeated this exact same procedure with the contralateral pedicle.  I then drilled, tapped the hole and then used my curette to loosen up the bone.  I then advanced the Kyphon inflatable bone tamp and inflated 3.5 mL onto both sides.  I then deflated the balloons and then inserted the cement that had already been mixed on the back table.  I had an excellent fill and confirmed satisfactory position of cement in the AP and lateral plane.  There was a slight lateral escape of cement, but it was not significant.  There was no superior, inferior or posterior cement leak.  Once the cement was allowed to cure, I removed the trocar, cleaned the skin and injected 0.25% Marcaine for postoperative analgesia.  A single simple 3-0 Monocryl suture was used to close the skin and a Band-Aid was applied.  The patient was ultimately extubated, transferred to the  PACU without incident.  At the end of the case, all needle and sponge counts were correct.     Kory Rains D. Rolena Infante, M.D.     DDB/MEDQ  D:  08/15/2016  T:  08/15/2016  Job:  [id number]

## 2016-08-27 NOTE — Discharge Summary (Signed)
Physician Discharge Summary  Patient ID: Roberta Travis MRN: [id number] DOB/AGE: [birth year] 68 y.o.  Admit date: 08/15/2016 Discharge date: 08/27/2016  Admission Diagnoses:  L4 Compression Fracture  Discharge Diagnoses:  Active Problems:   Compression fracture of fourth lumbar vertebra [facility])   Past Medical History:  Diagnosis Date  . Arthritis   . Asthma    asthma  . Bronchitis    recent  . Cancer ([place])    skin  . Hepatitis    as a child- on bedrest 6 months  . History of kidney stones   . Peripheral vascular disease ([place]) 2007   dvt leg  . Pneumonia    hx  . Stroke [facility]) 2007   "mini"  . UTI (urinary tract infection)    chronic- none in a while- on preventive antibiotic    Surgeries: Procedure(s): L4 KYPHOPLASTY on 08/15/2016   Consultants (if any):   Discharged Condition: Improved  Hospital Course: [name] is an 66 y.o. female who was admitted 08/15/2016 with a diagnosis of L4 Compression Fracture and went to the operating room on 08/15/2016 and underwent the above named procedures.  Post op day 0 pts pain was well controlled on oral medication. Pt able to ambulate.  Pt urinating w/o difficulty.   She was given perioperative antibiotics:  Anti-infectives    Start     Dose/Rate Route Frequency Ordered Stop   08/15/16 1630  ceFAZolin (ANCEF) IVPB 1 g/50 mL premix  Status:  Discontinued     1 g 100 mL/hr over 30 Minutes Intravenous Every 8 hours 08/15/16 1106 08/15/16 1704   08/15/16 0548  ceFAZolin (ANCEF) IVPB 2g/100 mL premix     2 g 200 mL/hr over 30 Minutes Intravenous 30 min pre-op 08/15/16 0548 08/15/16 0840    .  She was given sequential compression devices, early ambulation, and TED for DVT prophylaxis.  She benefited maximally from the hospital stay and there were no complications.    Recent vital signs:  Vitals:   08/15/16 1040 08/15/16 1100  BP:  91/71  Pulse: 85 85  Resp: 14 18  Temp: 97 F (36.1 C) 97 F (36.1 C)    Recent  laboratory studies:  Lab Results  Component Value Date   HGB 12.9 08/08/2016   Lab Results  Component Value Date   WBC 4.4 08/08/2016   PLT 310 08/08/2016   No results found for: INR Lab Results  Component Value Date   NA 138 08/08/2016   K 4.7 08/08/2016   CL 104 08/08/2016   CO2 27 08/08/2016   BUN 19 08/08/2016   CREATININE 0.67 08/08/2016   GLUCOSE 86 08/08/2016    Discharge Medications:   Allergies as of 08/15/2016      Reactions   Sulfa Antibiotics Anaphylaxis, Shortness Of Breath, Swelling   Swelling in mouth/tongue   Epipen [epinephrine] Hypertension   "Blood pressure through roof"   Other    Iodized salt made face break out 50 years ago. Pt has not had any problems with iodine on skin      Medication List    STOP taking these medications   acetaminophen 650 MG CR tablet Commonly known as:  TYLENOL   doxycycline 100 MG capsule Commonly known as:  VIBRAMYCIN     TAKE these medications   alendronate 70 MG tablet Commonly known as:  FOSAMAX Take 70 mg by mouth every Thursday.   atorvastatin 20 MG tablet Commonly known as:  LIPITOR Take 20 mg by mouth  daily.   Biotin 5000 MCG Tabs Take 5,000 mcg by mouth daily. Hair/Skin/Nails   CALCIUM 600 PO Take 600 mg by mouth daily.   cholecalciferol 1000 units tablet Commonly known as:  VITAMIN D Take 1,000 Units by mouth daily.   diphenhydrAMINE 25 mg capsule Commonly known as:  BENADRYL Take 25 mg by mouth 2 (two) times daily as needed (for allergies).   diphenhydramine-acetaminophen 25-500 MG Tabs tablet Commonly known as:  TYLENOL PM Take 2 tablets by mouth at bedtime as needed (for back pain.).   docusate sodium 100 MG capsule Commonly known as:  COLACE Take 100 mg by mouth daily as needed for mild constipation.   gabapentin 300 MG capsule Commonly known as:  NEURONTIN Take 300-600 mg by mouth 2 (two) times daily. 300 mg in the morning & 600 mg in the evening   HYDROcodone-acetaminophen 5-325  MG tablet Commonly known as:  NORCO Take 1 tablet by mouth every 4 (four) hours as needed.   METAMUCIL 0.52 g capsule Generic drug:  psyllium Take 1.04 g by mouth daily.   methocarbamol 500 MG tablet Commonly known as:  ROBAXIN Take 1 tablet (500 mg total) by mouth 3 (three) times daily as needed for muscle spasms.   multivitamin with minerals Tabs tablet Take 1 tablet by mouth daily. One-A-Day Women[facility]   ondansetron 4 MG tablet Commonly known as:  ZOFRAN Take 1 tablet (4 mg total) by mouth every 8 (eight) hours as needed for nausea or vomiting.   PROBIOTIC PO Take 1 capsule by mouth daily.   VENTOLIN HFA 108 (90 Base) MCG/ACT inhaler Generic drug:  albuterol Inhale 2 puffs into the lungs every 4 (four) hours as needed for cough.       Diagnostic Studies: Dg Lumbar Spine 2-3 Views  Result Date: 08/15/2016 CLINICAL DATA:  L4 kyphoplasty. EXAM: LUMBAR SPINE - 2-3 VIEW; DG C-ARM 61-120 MIN COMPARISON:  None. FINDINGS: Two C-arm images show bilateral approach with methylmethacrylate bilaterally within the L4 vertebral body. There is good vertebral body distribution, with some extension into paravertebral veins right more than left, not usually significant. No evidence of dorsal extension. IMPRESSION: Bilateral vertebral augmentation. Electronically Signed   By: Nelson Chimes M.D.   On: 08/15/2016 09:47   Dg C-arm 1-60 Min  Result Date: 08/15/2016 CLINICAL DATA:  L4 kyphoplasty. EXAM: LUMBAR SPINE - 2-3 VIEW; DG C-ARM 61-120 MIN COMPARISON:  None. FINDINGS: Two C-arm images show bilateral approach with methylmethacrylate bilaterally within the L4 vertebral body. There is good vertebral body distribution, with some extension into paravertebral veins right more than left, not usually significant. No evidence of dorsal extension. IMPRESSION: Bilateral vertebral augmentation. Electronically Signed   By: Nelson Chimes M.D.   On: 08/15/2016 09:47    Disposition: 01-Home or Self Care Pt  will present to clinic in 2 weeks Post op medication given to pt at DC  Discharge Instructions    Incentive spirometry RT    Complete by:  As directed    Q2H while awake x 48hrs then QID and prn.      Follow-up Information    Dahlia Bailiff, MD. Schedule an appointment as soon as possible for a visit in 2 weeks.   Specialty:  Orthopedic Surgery Why:  If symptoms worsen, For suture removal, For wound re-check Contact information: [address] [place]  [id number]            Signed: Valinda Hoar 08/27/2016, 3:30 PM

## 2018-08-12 IMAGING — RF DG LUMBAR SPINE 2-3V
1 series · 2 of 2 positions shown · non-contrast
Comparison: None.

CLINICAL DATA: L4 kyphoplasty.

EXAM:
LUMBAR SPINE - 2-3 VIEW; DG C-ARM 61-120 MIN

[Series 1: run · 2 of 2 slices shown]
[im 1/2]
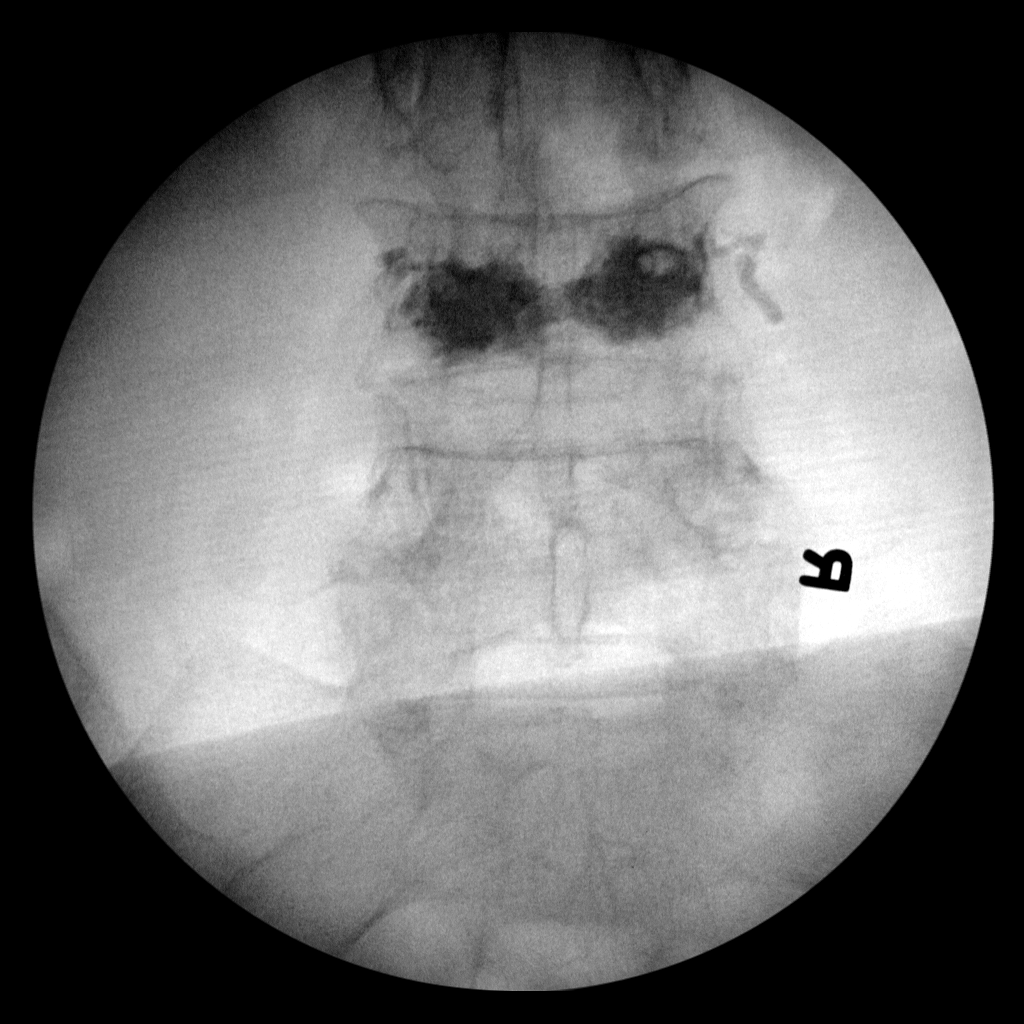
[im 2/2]
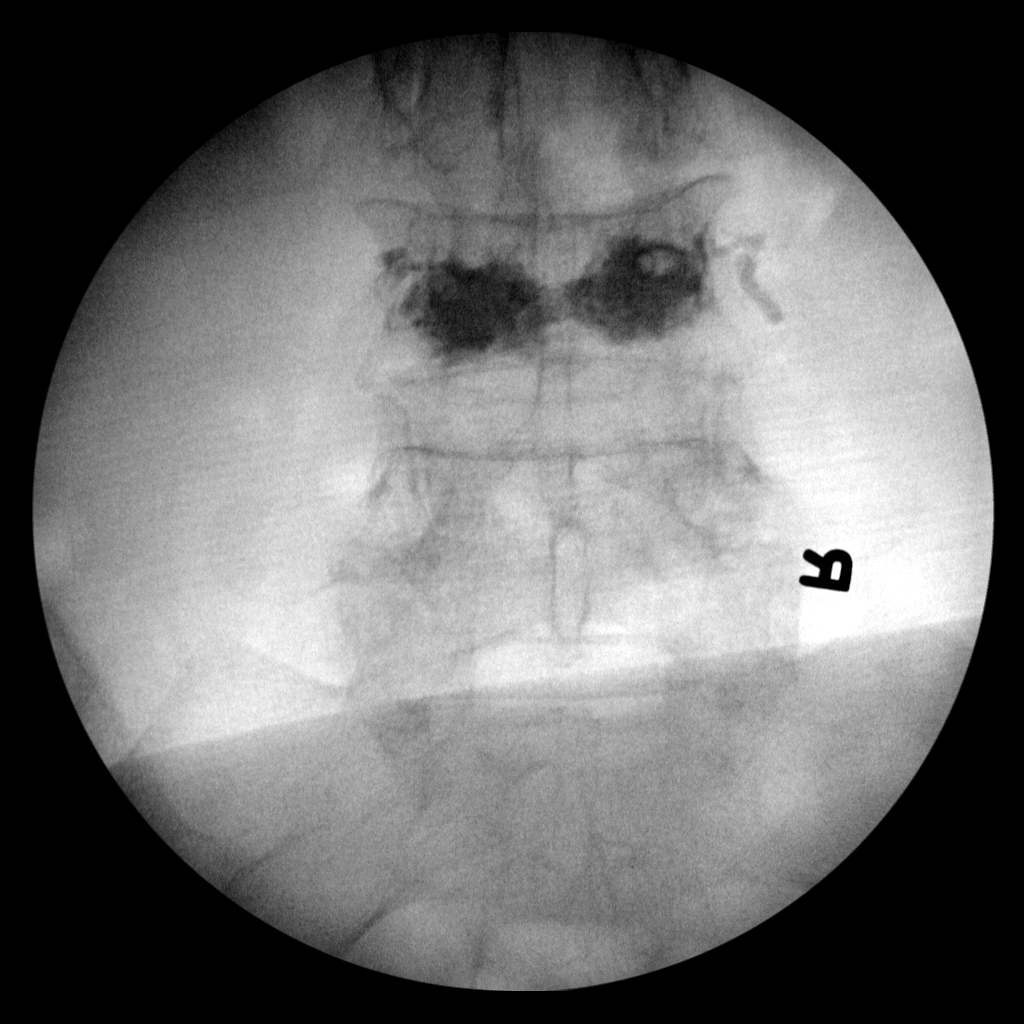

[2 of 2 positions shown; findings below may reference images not displayed]

FINDINGS: Two C-arm images show bilateral approach with methylmethacrylate
bilaterally within the L4 vertebral body. There is good vertebral
body distribution, with some extension into paravertebral veins
right more than left, not usually significant. No evidence of dorsal
extension.
IMPRESSION: Bilateral vertebral augmentation.

## 2024-10-28 ENCOUNTER — Ambulatory Visit (HOSPITAL_COMMUNITY): Admit: 2024-10-28 | Admitting: Orthopedic Surgery
